# Patient Record
Sex: Female | Born: 1990 | Race: White | Hispanic: No | Marital: Single
Health system: Midwestern US, Community
[De-identification: ages and names within clinical notes are randomized; demographics above are authoritative.]

## PROBLEM LIST (undated history)

## (undated) DIAGNOSIS — S93401A Sprain of unspecified ligament of right ankle, initial encounter: Secondary | ICD-10-CM

---

## 2015-10-14 ENCOUNTER — Encounter: Admit: 2015-10-14

## 2015-10-14 ENCOUNTER — Inpatient Hospital Stay
Admit: 2015-10-14 | Discharge: 2015-10-15 | Disposition: A | Discharge status: Home / Self Care | Attending: Emergency Medicine

## 2015-10-14 DIAGNOSIS — S22080A Wedge compression fracture of T11-T12 vertebra, initial encounter for closed fracture: Secondary | ICD-10-CM

## 2015-10-14 MED ORDER — CYCLOBENZAPRINE HCL 10 MG PO TABS
10 MG | Freq: Once | ORAL | Status: AC
Start: 2015-10-14 — End: 2015-10-14
  Administered 2015-10-14: 23:00:00 10 mg via ORAL

## 2015-10-14 MED ORDER — IBUPROFEN 800 MG PO TABS
800 MG | Freq: Once | ORAL | Status: AC
Start: 2015-10-14 — End: 2015-10-14
  Administered 2015-10-14: 23:00:00 800 mg via ORAL

## 2015-10-14 MED FILL — CYCLOBENZAPRINE HCL 10 MG PO TABS: 10 MG | ORAL | Qty: 1

## 2015-10-14 MED FILL — IBUPROFEN 800 MG PO TABS: 800 MG | ORAL | Qty: 1

## 2015-10-14 NOTE — ED Provider Notes (Signed)
[facility]  eMERGENCY dEPARTMENT eNCOUnter      Pt Name: Joyce Anderson  MRN: [id number]  Birthdate 11-19-1990  Date of evaluation: 10/14/2015  Provider: Cain SaupeBradley J Langley Ingalls, PA-C   Attending Physician: Riesa Popeharlene Alison Miller, *    Chief Complaint:    Chief Complaint   Patient presents with   ??? Back Injury     Was at a rock climbing place, harnessed but on the ground. Someone above her fell and landed on her. Pt initially had neck pain, not now. C/o pain upper/mid back pain       Nursing Notes, Past Medical Hx, Past Surgical Hx, Social Hx, Allergies, and Family Hx were all reviewed and agreed with or any disagreements were addressed in the HPI.    HPI:  (Location, Duration, Timing, Severity, Quality, Assoc Sx, Context, Modifying factors)  This is a  25 y.o. female With no known medical history who presents to the emergency department tonight stating prior to arrival she was rock climbing when a friend of hers fell off the wall and on top of her.  She denies hitting her head or losing consciousness.  She states she was initially having neck and mid back pain but is now having mostly left-sided mid back pain.  She denies changes in bowel or bladder habits, incontinence, saddle paresthesia, numbness, tingling, sensory or motor deficits in her extremities.  She denies chest pain or shortness of breath.  She has no GI complaints.    Past Medical/Surgical History:  History reviewed. No pertinent past medical history.      Procedure Laterality Date   ??? Appendectomy         Medications:  Previous Medications    LEVONORGESTREL (MIRENA, 52 MG,) 20 MCG/24HR IUD    1 each by Intrauterine route once         Review of Systems:  Review of Systems   Constitutional: Negative for chills and fever.   Respiratory: Negative for chest tightness and shortness of breath.    Cardiovascular: Negative for chest pain and palpitations.   Gastrointestinal: Negative for abdominal pain, diarrhea, nausea and vomiting.    Genitourinary: Negative for difficulty urinating, dysuria, flank pain, frequency, hematuria, urgency, vaginal bleeding, vaginal discharge and vaginal pain.   Musculoskeletal: Positive for back pain and neck stiffness. Negative for arthralgias, gait problem, joint swelling, myalgias and neck pain.   Neurological: Negative for dizziness, weakness, light-headedness, numbness and headaches.   All other systems reviewed and are negative.    Positives and Pertinent negatives as per HPI.  Except as noted above in the ROS, problem specific ROS was completed and is negative.    Physical Exam:  Physical Exam   Constitutional: She is oriented to person, place, and time. She appears well-developed and well-nourished. No distress.   HENT:   Head: Normocephalic and atraumatic.   Eyes: Conjunctivae and EOM are normal. Pupils are equal, round, and reactive to light. Right eye exhibits no discharge. Left eye exhibits no discharge.   Neck: Normal range of motion. Neck supple.   Cardiovascular: Normal rate, regular rhythm, normal heart sounds and intact distal pulses.  Exam reveals no gallop and no friction rub.    No murmur heard.  Pulmonary/Chest: Effort normal and breath sounds normal. No respiratory distress.   Abdominal: Soft. Bowel sounds are normal. She exhibits no distension and no mass. There is no tenderness. There is no rebound and no guarding.   Musculoskeletal: Normal range of motion.  Cervical back: She exhibits spasm.        Thoracic back: She exhibits spasm.   Patient denies midline tenderness on palpation of the cervical, thoracic or lumbar spine. she does have tenderness on palpation of the left cervical and thoracic paraspinous muscle tenderness. she has full range of motion of all extremities with 5/5 motor strength symmetrically.  Patient denies sensory deficits and extremities are neurovascularly intact.  Extremities with good color and capillary refill <2 seconds. There is no extremity edema or signs of  vascular compromise.      Neurological: She is alert and oriented to person, place, and time. She has normal strength. No sensory deficit. Coordination and gait normal. GCS eye subscore is 4. GCS verbal subscore is 5. GCS motor subscore is 6.   Skin: Skin is warm and dry. She is not diaphoretic. No pallor.   Psychiatric: She has a normal mood and affect. Her behavior is normal.   Nursing note and vitals reviewed.      MEDICAL DECISION MAKING    Vitals:    Vitals:    10/14/15 1832   BP: 118/75   Pulse: 88   Resp: 20   Temp: 98.4 ??F (36.9 ??C)   SpO2: 100%   Weight: 141 lb (64 kg)   Height:  (1.702 m)     RADIOLOGY:   Non-plain film images such as CT, Ultrasound and MRI are read by the radiologist. I, Cain Saupe, PA-C have directly visualized the radiologic plain film image(s) with the below findings:    Interpretation per the Radiologist below, if available at the time of this note:    XR Thoracic Spine Standard   Final Result   Slight loss of height of the superior endplate of the T11 vertebral body   suggesting a nondisplaced fracture.         XR Cervical Spine Standard Extended   Final Result   No acute abnormality of the cervical spine.             MEDICAL DECISION MAKING / ED COURSE:      PROCEDURES:   Procedures    None    Patient was given:  Medications   ibuprofen (ADVIL;MOTRIN) tablet 800 mg (800 mg Oral Given 10/14/15 1849)   cyclobenzaprine (FLEXERIL) tablet 10 mg (10 mg Oral Given 10/14/15 1849)       Patient presented to the emergency department tonight complaining of left-sided neck and mid back pain stating prior to arrival she was rock climbing when a friend accidentally fell on top of her.  She did not hit her head or lose consciousness.  She denies midline cervical, thoracic or lumbar spine tenderness.  There are no signs of cauda equina.  She sustained no other injuries.  X-rays of her cervical spine showed no acute abnormality.  X-rays of her thoracic spine shows a slight loss of height of  the superior endplate of the T11 vertebral body suggesting a nondisplaced fracture.  Spine surgeon, Dr. Jacquenette Shone was paged however we were told that he is out on vacation.  Orthopedic surgeon, Dr. Threasa Beards, was then paged who recommends consult to Dr. Sande Brothers. Dr. Sande Brothers was then consulted to agrees to see the patient in the office this week. Pt denies any history of new numbness, weakness, incontinence of bowel or bladder, constipation, saddle anesthesia or paresthesias. I estimate there is LOW risk for ABDOMINAL AORTIC ANEURYSM, CAUDA EQUINA SYNDROME, EPIDURAL MASS LESION, OR CORD COMPRESSION, thus I consider the  discharge disposition reasonable.  Patient will be discharged with Naprosyn.    The patient tolerated their visit well. They were seen and evaluated by the attending physician, Virgel Gess, MD who agreed with the assessment and plan. The patient and / or the family were informed of the results of any tests, a time was given to answer questions, a plan was proposed and they agreed with plan.     CLINICAL IMPRESSION:  1. Traumatic compression fracture of T11 thoracic vertebra, closed, initial encounter (HCC)        DISPOSITION Decision to Discharge    PATIENT REFERRED TO:  Nadene Rubins, MD  [phone] [facility]  [address]  [place] [address]  [phone]    Schedule an appointment as soon as possible for a visit        DISCHARGE MEDICATIONS:  New Prescriptions    NAPROXEN (NAPROSYN) 500 MG TABLET    Take 1 tablet by mouth 2 times daily for 20 doses       DISCONTINUED MEDICATIONS:  Discontinued Medications    No medications on file              (Please note the MDM and HPI sections of this note were completed with a voice recognition program.  Efforts were made to edit the dictations but occasionally words are mis-transcribed.)    Electronically signed, Cain Saupe, PA-C,          Cain Saupe, PA-C  10/14/15 2039

## 2015-10-14 NOTE — ED Notes (Signed)
Pt able to ambulate with steady gait.  Denies any leg numbness, tingling, or weakness.  Discharged to home after going to discharge office.     Kathi SimpersShelley Jolene Floyce Bujak, RN  10/14/15 [phone]

## 2015-10-14 NOTE — ED Provider Notes (Signed)
[facility]      Pt Name: Joyce Anderson  MRN: [id number]  Birthdate 08-29-1990  Date of evaluation: 10/14/2015  Provider: Sidonie DickensHARLENE A Kennedi Lizardo, MD  I have personally performed a face to face diagnostic evaluation on this patient. I have fully participated in the care of this patient. I have reviewed and agree with all pertinent clinical information including history, physical exam, diagnostic tests, and the plan.     HPI: Joyce Anderson presented with   Chief Complaint   Patient presents with   ??? Back Injury     Was at a rock climbing place, harnessed but on the ground. Someone above her fell and landed on her. Pt initially had neck pain, not now. C/o pain upper/mid back pain     Joyce Anderson has no past medical history on file.  She has a past surgical history that includes Appendectomy.    Vital Signs: Blood pressure 118/75, pulse 88, temperature 98.4 ??F (36.9 ??C), resp. rate 20, height 5\' 7"  (1.702 m), weight 141 lb (64 kg), SpO2 100 %.  Constitutional:  25 y.o. female nontoxic, alert, speech normal, mentation normal, pupils equal, normal coordination of extremities, no facial asymmetry   HENT:  Atraumatic, oral mucosa moist  Neck:  No visible JVD, supple  Chest/Lungs:  Respiratory effort normal, clear, regular  Abdomen:  Non-distended, soft, NT  Back:  No gross deformity, mid back tenderness  Extremities:  Normal tone and perfusion    Medical Decision Making and Plan:  I agree with Ulice BrilliantBrad Barnes, PA.  Spinal fx without neurologic symptoms.  To follow up with Dr. Sande BrothersFerree.  She was given appropriate discharge instructions and she agrees to return to the ED promptly if worsening symptoms.  Referral given for follow up care.     RADIOLOGY:   Plain x-rays were viewed by me: Xr Cervical Spine Standard Extended    Result Date: 10/14/2015  EXAMINATION: 5 VIEWS OF THE CERVICAL SPINE 10/14/2015 6:49 pm COMPARISON: None. HISTORY: ORDERING SYSTEM PROVIDED HISTORY: injury TECHNOLOGIST PROVIDED HISTORY:  Ordering Physician Provided Reason for Exam: Was at a rock climbing place, harnessed but on the ground. Someone above her fell and landed on her. Pt initially had neck pain, not now. C/o pain upper/mid back pain Acuity: Acute Type of Encounter: Initial FINDINGS: All 7 cervical vertebrae are visualized and appear normal in height and alignment.   There is no evidence of prevertebral soft tissue edema or fracture.  The base of the odontoid appears intact.     No acute abnormality of the cervical spine.     Xr Thoracic Spine Standard    Result Date: 10/14/2015  EXAMINATION: 3 VIEWS OF THE THORACIC SPINE 10/14/2015 6:49 pm COMPARISON: None. HISTORY: ORDERING SYSTEM PROVIDED HISTORY: Injury TECHNOLOGIST PROVIDED HISTORY: Ordering Physician Provided Reason for Exam: Was at a rock climbing place, harnessed but on the ground. Someone above her fell and landed on her. Pt initially had neck pain, not now. C/o pain upper/mid back pain Acuity: Acute Type of Encounter: Initial FINDINGS: There is a slight levoscoliosis at the thoracolumbar junction. There is a slight loss of height of the superior endplate of approximately the T11 vertebral body.  Otherwise, no acute fracture or spondylolisthesis. No significant degenerative changes     Slight loss of height of the superior endplate of the T11 vertebral body suggesting a nondisplaced fracture.     Medications administered:  Medications   ibuprofen (ADVIL;MOTRIN) tablet 800 mg (800 mg Oral Given 10/14/15 1849)  cyclobenzaprine (FLEXERIL) tablet 10 mg (10 mg Oral Given 10/14/15 1849)     Discharge Medication List as of 10/14/2015  8:46 PM      START taking these medications    Details   naproxen (NAPROSYN) 500 MG tablet Take 1 tablet by mouth 2 times daily for 20 doses, Disp-20 tablet, R-0Print           FOLLOW UP:    Nadene Rubins, MD  [phone] [facility]  [address]  [place] [address]  [phone]    Schedule an appointment as soon as possible for a visit      FINAL IMPRESSION:     1. Traumatic compression fracture of T11 thoracic vertebra, closed, initial encounter [facility])            Riesa Pope, MD  10/15/15 [phone]

## 2015-10-15 ENCOUNTER — Ambulatory Visit: Admit: 2015-10-15 | Discharge: 2015-10-15 | Payer: BLUE CROSS/BLUE SHIELD | Attending: Orthopaedic Surgery of the Spine

## 2015-10-15 DIAGNOSIS — S22000A Wedge compression fracture of unspecified thoracic vertebra, initial encounter for closed fracture: Secondary | ICD-10-CM

## 2015-10-15 MED ORDER — NAPROXEN 500 MG PO TABS
500 MG | ORAL_TABLET | Freq: Two times a day (BID) | ORAL | 0 refills | Status: AC
Start: 2015-10-15 — End: 2015-10-24

## 2015-10-15 NOTE — Progress Notes (Signed)
 History of present illness:   Ms. Joyce Anderson  is a pleasant 25 y.o. female kindly referred by [facility] emergency room for consultation regarding her pain and her thoracolumbar junction. She states her pain began immediately after a accident while helping a fellow rock climber.  Her fellow climber struck her around her shoulder as he was falling.  She denies lower extremity symptoms, saddle anesthesia and bowel or bladder dysfunction.     Past medical history:  Her past medical history has been reviewed and is non-contributory to her present illness.    Past surgical history:  Her past surgical history has been reviewed and is non-contributory to her present illness.    Her medications and allergies were reviewed.    Social history:  Her social history has been reviewed and she has never smoked      Review of symptoms:  Patient's review of symptoms was reviewed and is negative for recent weight loss, fatigue, chills, visual disturbances, blood in stool or urine, recent infection, chest pain, or shortness of breath.    Physical examination:  Ms. Joyce Anderson's most recent vitals:  Vitals  BP: 109/75  Pulse: 74  Height: 5\' 7"  (170.2 cm)  Weight: 141 lb (64 kg)  Body mass index is 22.08 kg/(m^2).    She is well-developed and well-nourished, is in no obvious pain and alert and oriented to person, place, and time. She demonstrates appropriate mood and affect.    Her skin is warm and dry.  Her gait and posture are normal. She has minimal tenderness over her thoracic and lumbar spines without obvious muscle spasm. The skin over her lumbar spine is normal without a surgical scar. She has 5/5 strength of EHLs, FHLs, foot evertors, ankle dorsiflexors, plantarflexors, quadriceps, hamstrings, iliopsoas, abductors and adductors about the hips bilaterally. She has a negative straight leg raise, bilaterally.  Achilles and quadriceps reflexes are 1+. Sensation is intact to light touch L3 to S1 bilaterally. She has no clonus.  Hip range of motion painless.    Imaging:  I reviewed x-ray images of her thoracic spine that were obtained in the emergency room.  They show a an acute T11 superior endplate fracture.    Assessment:  T11 superior endplate fracture    Plan:  We discussed treatment options including observation and bracing.  She is going to try a brace.  We discussed avoiding bending twisting and lifting more than 10 pounds and wearing the brace while out of bed.  She'll return in 3 weeks for repeat AP lateral thoracolumbar x-rays.

## 2015-10-15 NOTE — Telephone Encounter (Signed)
Spoke with patient and let her know that Dr. Sande BrothersFerree was willing for her to come in the office this morning to be seen.  Her appt is for 10:30.

## 2015-11-05 ENCOUNTER — Ambulatory Visit: Admit: 2015-11-05 | Discharge: 2015-11-05 | Payer: BLUE CROSS/BLUE SHIELD | Attending: Surgical

## 2015-11-05 ENCOUNTER — Ambulatory Visit: Admit: 2015-11-05 | Payer: BLUE CROSS/BLUE SHIELD

## 2015-11-05 DIAGNOSIS — S22080D Wedge compression fracture of T11-T12 vertebra, subsequent encounter for fracture with routine healing: Secondary | ICD-10-CM

## 2015-11-05 NOTE — Progress Notes (Signed)
Chief Complaint   Patient presents with   ??? Follow-up     T11 compression fracture.  She states that she feels much better today.  She states that she wears her brace all time except to sleep.       Joyce Anderson arrives today for follow up for her T11 compression fracture. She reports significant improvement in her back pain since last visit. She rates her pain today 0/10. She notes intermittent muscular pain from the brace, but otherwise feels her symptoms have almost completely resolved. She denies new lower extremity symptoms or bowel or bladder dysfunction. She denies saddle numbness. She wears her brace full time except for when sleeping.    Exam:  Vitals:    11/05/15 1035   BP: 119/84   Pulse: 76       General Exam:  Pt is alert and oriented x 3 and in no acute distress.  Gait is heel toe with no limp or instability.  Mood and affect are appropriate.    Back:  No deformity.  Negative surgical scar. No rash.  Negative for back pain on flexion.  Positive pain bilaterally on extension.  reduced ROM overall.  Negative pain on palpation.    Lower Extremities:   She has 5/5 strength of EHLs, FHLs, foot evertors, ankle dorsiflexors, plantarflexors, quadriceps, hamstrings, iliopsoas, abductors and adductors about the hips bilaterally. She has a negative straight leg raise, bilaterally.  Achilles and quadriceps reflexes are 1+. Sensation is intact to light touch L3 to S1 bilaterally. She has no clonus. Hip range of motion painless.    Imaging: AP and lateral xray images of her thoracic spine were obtained in the office today and independently reviewed. They show no additional collapse of her T11 compression fracture.    Impression:  1. Traumatic compression fracture of T11 thoracic vertebra, with routine healing, subsequent encounter  XR Thoracolumbar Spine Standard       A/P  Overall doing well.  She will continue with her brace and will continue to avoid lifting more than 10 lbs. She will return in 4-6 weeks for repeat  images of her thoracic spine.    Jolene ProvostKelly Tahjae Clausing, PA-C    Cc: No primary care provider on file.

## 2015-11-15 NOTE — Telephone Encounter (Signed)
Relayed message to pt.

## 2015-11-15 NOTE — Telephone Encounter (Signed)
Dx T11 compress fx. Is this OK, as long as she is careful?

## 2015-11-15 NOTE — Telephone Encounter (Signed)
Pt is calling to see if it is ok for her to take off her brace on May 6th for the day she is trying on wedding dresses  Please advise

## 2015-11-15 NOTE — Telephone Encounter (Signed)
That would be fine, thanks.

## 2015-12-11 ENCOUNTER — Ambulatory Visit: Admit: 2015-12-11 | Discharge: 2015-12-11 | Payer: BLUE CROSS/BLUE SHIELD | Attending: Surgical

## 2015-12-11 DIAGNOSIS — S22080D Wedge compression fracture of T11-T12 vertebra, subsequent encounter for fracture with routine healing: Secondary | ICD-10-CM

## 2015-12-11 NOTE — Progress Notes (Signed)
Chief Complaint   Patient presents with   ??? Back Pain     T11 compression fracture f/u     Joyce Anderson arrives today for follow up for her T11 compression fracture. She reports significant improvement in her back pain since last visit. She rates her pain today 0/10. She notes intermittent muscular pain when she goes without her brace, but otherwise feels her symptoms have almost completely resolved. She denies new lower extremity symptoms or bowel or bladder dysfunction. She denies saddle numbness. She is not taking anything for pain at this time.    Exam:  Vitals:    12/11/15 0930   BP: 118/76   Pulse: 78       General Exam:  Pt is alert and oriented x 3 and in no acute distress.  Gait is heel toe with no limp or instability.  Mood and affect are appropriate.    Back:  No deformity.  Negative surgical scar. No rash.  Negative for back pain on flexion.  Positive pain bilaterally on extension.  reduced ROM overall.  Negative pain on palpation.    Lower Extremities:   She has 5/5 strength of EHLs, FHLs, foot evertors, ankle dorsiflexors, plantarflexors, quadriceps, hamstrings, iliopsoas, abductors and adductors about the hips bilaterally. She has a negative straight leg raise, bilaterally.  Achilles and quadriceps reflexes are 1+. Sensation is intact to light touch L3 to S1 bilaterally. She has no clonus. Hip range of motion painless.    Imaging: AP and lateral xray images of her thoracic spine were obtained in the office today and independently reviewed. They show no additional collapse of her T11 compression fracture.    Impression:  1. Traumatic compression fracture of T11 thoracic vertebra, with routine healing, subsequent encounter  XR Thoracic Spine Limited       A/P  Overall doing well.  She will continue with her brace intermittently. She will return in 4-6 weeks for repeat images of her thoracic spine. If imaging stable at that visit, she can follow up PRN.    Jolene ProvostKelly Mitsugi Schrader, PA-C

## 2016-01-15 ENCOUNTER — Ambulatory Visit: Admit: 2016-01-15 | Discharge: 2016-01-15 | Payer: BLUE CROSS/BLUE SHIELD | Attending: Surgical

## 2016-01-15 ENCOUNTER — Ambulatory Visit: Admit: 2016-01-15 | Discharge: 2016-01-28 | Payer: BLUE CROSS/BLUE SHIELD

## 2016-01-15 DIAGNOSIS — S22080D Wedge compression fracture of T11-T12 vertebra, subsequent encounter for fracture with routine healing: Secondary | ICD-10-CM

## 2016-01-15 NOTE — Progress Notes (Signed)
Chief Complaint   Patient presents with   ??? Follow-up     T11 compression fx      Joyce Anderson arrives today for follow up for her T11 compression fracture. She reports significant improvement in her back pain since last visit. She rates her pain today 0/10. 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 She admits to going without her brace most days for the past month. She denies new lower extremity symptoms or bowel or bladder dysfunction. She denies saddle numbness. She is not taking anything for pain at this time. She notes she has noted mild shaking in her hands when trying to decorate cookies, but denies other neck or upper extremity pain, numbness or weakness.    Exam:  Vitals:    01/15/16 1056   BP: 112/72   Pulse: 81       General Exam:  Pt is alert and oriented x 3 and in no acute distress.  Gait is heel toe with no limp or instability.  Mood and affect are appropriate.    Back:  No deformity.  Negative surgical scar. No rash.  Negative for back pain on flexion.  Positive pain bilaterally on extension.  reduced ROM overall.  Negative pain on palpation.    Lower Extremities:   She has 5/5 strength of EHLs, FHLs, foot evertors, ankle dorsiflexors, plantarflexors, quadriceps, hamstrings, iliopsoas, abductors and adductors about the hips bilaterally. She has a negative straight leg raise, bilaterally.  Achilles and quadriceps reflexes are 1+. Sensation is intact to light touch L3 to S1 bilaterally. She has no clonus. Hip range of motion painless.    Upper extremities:   She has 5/5 strength of her interosseous muscles, wrist dorsiflexors and volarflexors, biceps, triceps, deltoids, and internal and external rotators of her shoulders, bilaterally.  Sensation is intact to light touchfrom C6 to C8. She has no clonus and negative Hoffman's bilaterally.     Imaging: AP and lateral xray images of her thoracic spine were obtained in the  office today and independently reviewed. They show no additional collapse of her T11 compression fracture.    Impression:  1. Traumatic compression fracture of T11 thoracic vertebra, with routine healing, subsequent encounter  Ambulatory referral to Physical Therapy       A/P  Overall doing well.  She may follow up PRN. She will consider physical therapy or chiropractic care if she continues to have intermittent muscular pain in her back. She will also let us know or consider seeing neurology if the small tremor in her hands get worse or does not improve.     Jolene ProvostKelly Aydin Cavalieri, PA-C

## 2016-07-29 ENCOUNTER — Emergency Department: Admit: 2016-07-29

## 2016-07-29 ENCOUNTER — Inpatient Hospital Stay: Admit: 2016-07-29 | Discharge: 2016-07-29 | Disposition: A | Discharge status: Home / Self Care

## 2016-07-29 DIAGNOSIS — S29019A Strain of muscle and tendon of unspecified wall of thorax, initial encounter: Secondary | ICD-10-CM

## 2016-07-29 NOTE — Discharge Instructions (Signed)
ICE INSTRUCTIONS  ICE CUBES OR CRUSHED ICE IN A ZIPLOCK FREEZER BAG.  APPLY TO AFFECTED AREA 20 MIN ON AND 40 MIN OFF AS OFTEN AS POSSIBLE FOR THE NEXT 3-5 DAYS.    (APPLY DIRECTLY TO SKIN.  IF TOO PAINFUL PLACE WET CLOTH IN BETWEEN ICE AND SKIN.)        Where your TLSO brace if the pain becomes worse until you follow up with Dr. Alfredia FergusonFarree

## 2016-07-29 NOTE — ED Notes (Signed)
PT discharged at this time. PT given instructions for follow up and prescriptions. PT denies any needs or questions.       Ethelle LyonLaney Trenyce Loera, RN  07/29/16 [phone]
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
## 2016-10-20 ENCOUNTER — Encounter

## 2016-10-20 ENCOUNTER — Inpatient Hospital Stay: Attending: Internal Medicine

## 2016-10-20 ENCOUNTER — Encounter: Admit: 2016-10-20

## 2016-10-20 DIAGNOSIS — S93401A Sprain of unspecified ligament of right ankle, initial encounter: Secondary | ICD-10-CM
